# Patient Record
Sex: Female | Born: 1990 | Race: White | Hispanic: No | Marital: Single | Smoking: Never smoker
Health system: Southern US, Community
[De-identification: ages and names within clinical notes are randomized; demographics above are authoritative.]

## PROBLEM LIST (undated history)

## (undated) DIAGNOSIS — Z8619 Personal history of other infectious and parasitic diseases: Secondary | ICD-10-CM

## (undated) DIAGNOSIS — E119 Type 2 diabetes mellitus without complications: Secondary | ICD-10-CM

## (undated) DIAGNOSIS — R87629 Unspecified abnormal cytological findings in specimens from vagina: Secondary | ICD-10-CM

## (undated) DIAGNOSIS — A749 Chlamydial infection, unspecified: Secondary | ICD-10-CM

## (undated) HISTORY — PX: WISDOM TOOTH EXTRACTION: SHX21

## (undated) HISTORY — DX: Type 2 diabetes mellitus without complications: E11.9

---
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
## 2014-02-18 ENCOUNTER — Encounter: Payer: Self-pay | Admitting: *Deleted

## 2014-02-18 ENCOUNTER — Encounter: Payer: BC Managed Care – PPO | Attending: Endocrinology | Admitting: *Deleted

## 2014-02-18 VITALS — Ht 72.6 in | Wt 134.8 lb

## 2014-02-18 DIAGNOSIS — Z713 Dietary counseling and surveillance: Secondary | ICD-10-CM | POA: Insufficient documentation

## 2014-02-18 DIAGNOSIS — E1010 Type 1 diabetes mellitus with ketoacidosis without coma: Secondary | ICD-10-CM

## 2014-02-18 DIAGNOSIS — F509 Eating disorder, unspecified: Secondary | ICD-10-CM

## 2014-02-18 DIAGNOSIS — E109 Type 1 diabetes mellitus without complications: Secondary | ICD-10-CM | POA: Insufficient documentation

## 2014-02-18 NOTE — Patient Instructions (Signed)
Check glucose before each meal/snack.  Keep log and bring to next visit.  Also bring lab results

## 2014-02-18 NOTE — Progress Notes (Signed)
Patient was seen on 02/18/14 for nutrition counseling pertaining to disordered eating  Primary care MD: none Therapist: Dr. Rayann Heman at [facility] (weekly) and [facility] (weekly) Any other medical team members: Ardyth Harps, endocrinology (every 3 months).  Can see Wynne Dust  Assessment Weight 134 lb  Height 62 in Expected body weight: 160 lb Percent expected body weight: 83%  Eating history: Length of time: type 1 DM diagnosed at 15.  States she took the diagnosis pretty well.  Didn't fully understand what was going on.  Needed very low doses of insulin so it didn't change her life as much.  When she started having control issues around age [age].  Her glucose levels raised so high she had to stay home from school.  That stated to have an impact on her mental health and she didn't have support from her teachers.  She gained some weight in that time.  When she went off to college she had irregular eating schedules.  She fell into not taking her insulin by accident.  When she realized that she was loosing weight as a result, she started doing it on purpose.  She could eat whatever she wanted and lose weight.  She gets sick now very easily.  before she could go longer without getting sick.  She almost thought she didn't have dm because she wasn't getting sick.    States she feels "not good" about her body.  She got triggered when she saw her weight at Dr. Rinaldo Cloud office: 145 lb about 2 weeks ago.  She is terrified of going over 140 lb.  She has been really sick this year with not taking her insulin. She dropped out of school this semester.    DM: doesn't know how to count carbs or check feet.  Monitors glucose daily now: 200s-300s.  Hasnt' has any 400+ in the past week or so.  Used to not check for days.  Does take lantus 20 units.  Doesn't take novolog properly:: might wait to take insulin and then not take the correct amount.  hasnt' been takign novolog in a VERY long time. Knows healthy  ranges for blood sugars.  Last A1c was 14%  Previous treatments: worked with a nutritionist when she was diagnosed with diabetes,but not with her eating and doesn't remember much from previous meetings.  Has been working with [facility] for about a month or so.  Attempted to do inpatient treatment at [facility]: rushed to the ICU after 2 hours Goals for RD meetings: would like guidance on what to eat, how to eat.  Knows she doesn't have a good relationship with good.    Weight history:  Highest weight: 167 lb   Lowest weight: 126 lb Most consistent weight: 130-140  What would you like to weigh: < 135 How has weight changed in the past year: flucuated at lot.  Went through spells of taking insulin and not taking.  152-134  Medical Information:  Changes in hair, skin, nails since ED started: hair is very thin and brittle, dry skin and blisters on her hands.  Some neuropathy in her feet at night Chewing/swallowing difficulties: none Relux or heartburn: none, but feels pressure in her chest Trouble with teeth: none LMP without the use of hormones: started OCP in fall of 2014.  Have always had light periods.  They were fairly regular, but now she only has her cycle every few months.  Has gone up to 6 months without her period  Effect of exercise on menses    Effect of hormones on menses Constipation, diarrhea: normal bowel function  Mental health diagnosis: ED-DMT1   Dietary assessment: A typical day consists of 2-3 meals and 1-2 snacks  Safe foods include: raw vegetables Avoided foods include:pasta (will eat whatever when she doesn't take her insulin)  When she takes her insulin, she is much more restrictive  24 hour recall:  B: nothing L: chicken sandwich D: didn't eat much S: pot roast S: greek yogurt  [place] today Drinks a lot of diet cranberry juice, lots of diet drinks, water, diet Powerade   Compensatory behaviors           Restricting (calories, fat, carbs): calories [phone]  kcal/day.  SIV: denied  Diet pills: denies  Laxatives: denies  Diuretics:denies  Alcohol or drugs: denies  Exercise (what type): not much during the winter.  Has stationary bike or exercise videos.  None currently  Food rules or rituals (explain): denies  Binge: when not taking novolog  Estimated energy intake: [phone] kcal  Estimated energy needs: 2400 kcal 300 g CHO 120 g pro 80 g fat  Nutrition Diagnosis: NB-1.2 Harmful beliefs/attitudes about food or nutrition-related topics As related to adequate intake of fats, carbohydrates, proteins, and total calories.  As evidenced by insulin misuse and disordered eating.  Intervention/Goals: check blood sugar prior to eating and keep log   Meal plan:    3 meals    0 snacks To provide 1200 kcal    135 g CHO    90 g pro   33 g fat   Monitoring and Evaluation: Patient will follow up in 1 weeks.

## 2014-02-25 ENCOUNTER — Ambulatory Visit: Payer: BC Managed Care – PPO | Admitting: *Deleted

## 2014-02-25 ENCOUNTER — Ambulatory Visit: Payer: Self-pay | Admitting: *Deleted

## 2014-08-26 ENCOUNTER — Encounter: Payer: Self-pay | Admitting: Nurse Practitioner

## 2014-08-27 ENCOUNTER — Encounter (HOSPITAL_COMMUNITY): Payer: Self-pay | Admitting: *Deleted

## 2014-08-27 ENCOUNTER — Inpatient Hospital Stay (HOSPITAL_COMMUNITY): Payer: BC Managed Care – PPO

## 2014-08-27 ENCOUNTER — Inpatient Hospital Stay (HOSPITAL_COMMUNITY)
Admission: AD | Admit: 2014-08-27 | Discharge: 2014-08-27 | Disposition: A | Discharge status: Home / Self Care | Payer: BC Managed Care – PPO | Source: Ambulatory Visit | Attending: Obstetrics and Gynecology | Admitting: Obstetrics and Gynecology

## 2014-08-27 DIAGNOSIS — L293 Anogenital pruritus, unspecified: Secondary | ICD-10-CM | POA: Insufficient documentation

## 2014-08-27 DIAGNOSIS — N764 Abscess of vulva: Secondary | ICD-10-CM

## 2014-08-27 DIAGNOSIS — B3731 Acute candidiasis of vulva and vagina: Secondary | ICD-10-CM

## 2014-08-27 DIAGNOSIS — E109 Type 1 diabetes mellitus without complications: Secondary | ICD-10-CM | POA: Diagnosis not present

## 2014-08-27 DIAGNOSIS — B373 Candidiasis of vulva and vagina: Secondary | ICD-10-CM

## 2014-08-27 LAB — URINALYSIS, ROUTINE W REFLEX MICROSCOPIC
BILIRUBIN URINE: NEGATIVE
Glucose, UA: >1000 mg/dL — AB
Hgb urine dipstick: NEGATIVE
Ketones, ur: NEGATIVE mg/dL
Leukocytes, UA: NEGATIVE
NITRITE: NEGATIVE
Protein, ur: NEGATIVE mg/dL
Specific Gravity, Urine: <1.005 — ABNORMAL LOW (ref 1.005–1.030)
UROBILINOGEN UA: 0.2 mg/dL (ref 0.0–1.0)
pH: 6 (ref 5.0–8.0)

## 2014-08-27 LAB — WET PREP, GENITAL
Clue Cells Wet Prep HPF POC: NONE SEEN
Trich, Wet Prep: NONE SEEN
Yeast Wet Prep HPF POC: NONE SEEN

## 2014-08-27 LAB — GLUCOSE, CAPILLARY: GLUCOSE-CAPILLARY: 115 mg/dL — AB (ref 70–99)

## 2014-08-27 LAB — POCT PREGNANCY, URINE: Preg Test, Ur: NEGATIVE

## 2014-08-27 MED ORDER — DIPHENHYDRAMINE HCL 50 MG/ML IJ SOLN
25.0000 mg | Freq: Once | INTRAMUSCULAR | Status: AC
  Administered 2014-08-27: 09:00:00 via INTRAMUSCULAR
  Filled 2014-08-27: qty 1

## 2014-08-27 MED ORDER — FLUCONAZOLE 150 MG PO TABS: 150.0000 mg | ORAL_TABLET | Freq: Once | ORAL | Status: DC

## 2014-08-27 NOTE — MAU Note (Signed)
Pt presents to MAU with complaints of vaginal itching that has gotten worse over the last couple of days. Reports that she was evaluated at [facility] the end of last week for a regular check-up and given vaginal cream. Presents here today requesting to be evaluated by provider and prefers it not to be the [employer].

## 2014-08-27 NOTE — MAU Provider Note (Signed)
CC: Vaginal Itching    First Provider Initiated Contact with Patient 08/27/14 0830    Note: Seen for this problem at [facility] about a week ago and requests to be seen by a different practice, though we offered to call [facility] provider on call   HPI Carmen Henry is a 23 y.o.  nulligravda Type 1 diabetic who presents with onset a week ago of vulvar and vaginal pruritis which has worsened in the past 2 days. Unable to sleep due to itching. Took Advil this am. States she had STD testing and negative WP at [facility] and was given steroid cream to apply to vulva twice a day for 10 d. She states vulvar redness is worse and now has bump on labia. CBG 400s this am and dosed insulin, did not eat. Stressed out and missing work today.  LMP 08/03/14. Had amenorrhea related to weight loss and cycles remain irregular, taking Loestrin as directed. Mother very concerned that pelvic mass noted at GYN exam and advised to get [place]. Began Gardasil series.  Past Medical History  Diagnosis Date  . Diabetes mellitus without complication     OB History  No data available    History reviewed. No pertinent past surgical history.  History   Social History  . Marital Status: Single    Spouse Name: N/A    Number of Children: N/A  . Years of Education: N/A   Occupational History  . Not on file.   Social History Main Topics  . Smoking status: Never Smoker   . Smokeless tobacco: Not on file  . Alcohol Use: No  . Drug Use: No  . Sexual Activity: Yes    Birth Control/ Protection: Pill   Other Topics Concern  . Not on file   Social History Narrative  . No narrative on file    No current facility-administered medications on file prior to encounter.   Current Outpatient Prescriptions on File Prior to Encounter  Medication Sig Dispense Refill  . insulin aspart (NOVOLOG) 100 UNIT/ML injection Inject 1-40 Units into the skin 5 (five) times daily as needed for high blood sugar (sliding scales based on carbs  eaten).       . insulin glargine (LANTUS) 100 UNIT/ML injection Inject 25 Units into the skin at bedtime.       . Multiple Vitamin (MULTIVITAMIN) tablet Take 1 tablet by mouth daily.        No Known Allergies  ROS Pertinent items in HPI. Denies dysuria, urgency or frequency of urination, hematuria. Denies dyspareunia.  PHYSICAL EXAM Filed Vitals:   08/27/14 0821  BP: 127/95  Pulse: 105  Temp: 98.3 F (36.8 C)  Resp: 18     General: Well nourished, well developed female in some distress and anxious Cardiovascular: Normal rate Respiratory: Normal effort Abdomen: Soft, nontender Back: No CVAT Extremities: No edema Neurologic: Alert and oriented Speculum exam: External genitalia Very erythematous with stellate lesions, shaved pubic hair site, Left mid labium majora with 1 cm mildly tender lump, not to a head, no drainage. Vagina with erythema and physiologic white discharge, no blood; cervix clean Bimanual exam: cervix closed, no CMT; uterus NSSP; no adnexal tenderness or masses   LAB RESULTS Results for orders placed during the hospital encounter of 08/27/14 (from the past 24 hour(s))  URINALYSIS, ROUTINE W REFLEX MICROSCOPIC     Status: Abnormal   Collection Time    08/27/14  8:05 AM      Result Value Ref Range  Color, Urine YELLOW  YELLOW   APPearance CLEAR  CLEAR   Specific Gravity, Urine <1.005 (*) 1.005 - 1.030   pH 6.0  5.0 - 8.0   Glucose, UA >1000 (*) NEGATIVE mg/dL   Hgb urine dipstick NEGATIVE  NEGATIVE   Bilirubin Urine NEGATIVE  NEGATIVE   Ketones, ur NEGATIVE  NEGATIVE mg/dL   Protein, ur NEGATIVE  NEGATIVE mg/dL   Urobilinogen, UA 0.2  0.0 - 1.0 mg/dL   Nitrite NEGATIVE  NEGATIVE   Leukocytes, UA NEGATIVE  NEGATIVE  WET PREP, GENITAL     Status: Abnormal   Collection Time    08/27/14  8:55 AM      Result Value Ref Range   Yeast Wet Prep HPF POC NONE SEEN  NONE SEEN   Trich, Wet Prep NONE SEEN  NONE SEEN   Clue Cells Wet Prep HPF POC NONE SEEN  NONE  SEEN   WBC, Wet Prep HPF POC FEW (*) NONE SEEN  Pt  self-checked CBG 204, 1-2 hr later was 48 and food, juice given  IMAGING [place] prelim: no pelvic mass, sm amt FF  No results found. MAU COURSE Benadryl  IM given ROI [facility] encounter 08/20/14 for GYN exam. Records reviewed including documentation of midline pelvic mass. D/W Dr. Jolayne Panther re outpt [place] and F/U GYN clinic. Patient and her mother requesting [place] today due to school/work situation.   ASSESSMENT  1. Yeast infection involving the vagina and surrounding area   2. Furuncle of labia majora   Type 1 DM  PLAN Discharge home. See AVS for patient education.    Medication List         clobetasol ointment 0.05 %  Commonly known as:  TEMOVATE  Apply 1 application topically 2 (two) times daily.     fluconazole 150 MG tablet  Commonly known as:  DIFLUCAN  Take 1 tablet (150 mg total) by mouth once.     insulin aspart 100 UNIT/ML injection  Commonly known as:  novoLOG  Inject 1-40 Units into the skin 5 (five) times daily as needed for high blood sugar (sliding scales based on carbs eaten).     insulin glargine 100 UNIT/ML injection  Commonly known as:  LANTUS  Inject 25 Units into the skin at bedtime.     multivitamin tablet  Take 1 tablet by mouth daily.     norethindrone-ethinyl estradiol-iron 1.5-30 MG-MCG tablet  Commonly known as:  MICROGESTIN FE,GILDESS FE,LOESTRIN FE  Take 1 tablet by mouth daily.       Advised to call PMD re: glycemic control , Follow-up Information   Please follow up. (Keep your scheduled appointment with [facility] for follow up and Gardasil)       Danae Orleans, CNM 08/27/2014 8:57 AM

## 2014-08-27 NOTE — Discharge Instructions (Signed)
Abscess An abscess is an infected area that contains a collection of pus and debris.It can occur in almost any part of the body. An abscess is also known as a furuncle or boil. CAUSES  An abscess occurs when tissue gets infected. This can occur from blockage of oil or sweat glands, infection of hair follicles, or a minor injury to the skin. As the body tries to fight the infection, pus collects in the area and creates pressure under the skin. This pressure causes pain. People with weakened immune systems have difficulty fighting infections and get certain abscesses more often.  SYMPTOMS Usually an abscess develops on the skin and becomes a painful mass that is red, warm, and tender. If the abscess forms under the skin, you may feel a moveable soft area under the skin. Some abscesses break open (rupture) on their own, but most will continue to get worse without care. The infection can spread deeper into the body and eventually into the bloodstream, causing you to feel ill.  DIAGNOSIS  Your caregiver will take your medical history and perform a physical exam. A sample of fluid may also be taken from the abscess to determine what is causing your infection. TREATMENT  Your caregiver may prescribe antibiotic medicines to fight the infection. However, taking antibiotics alone usually does not cure an abscess. Your caregiver may need to make a small cut (incision) in the abscess to drain the pus. In some cases, gauze is packed into the abscess to reduce pain and to continue draining the area. HOME CARE INSTRUCTIONS   Only take over-the-counter or prescription medicines for pain, discomfort, or fever as directed by your caregiver.  If you were prescribed antibiotics, take them as directed. Finish them even if you start to feel better.  If gauze is used, follow your caregiver's directions for changing the gauze.  To avoid spreading the infection:  Keep your draining abscess covered with a  bandage.  Wash your hands well.  Do not share personal care items, towels, or whirlpools with others.  Avoid skin contact with others.  Keep your skin and clothes clean around the abscess.  Keep all follow-up appointments as directed by your caregiver. SEEK MEDICAL CARE IF:   You have increased pain, swelling, redness, fluid drainage, or bleeding.  You have muscle aches, chills, or a general ill feeling.  You have a fever. MAKE SURE YOU:   Understand these instructions.  Will watch your condition.  Will get help right away if you are not doing well or get worse. Document Released: 09/12/2005 Document Revised: 06/03/2012 Document Reviewed: 02/15/2012 [facility] Patient Information 2015 [place], [place]. This information is not intended to replace advice given to you by your health care provider. Make sure you discuss any questions you have with your health care provider.  Candida Infection A Candida infection (also called yeast, fungus, and Monilia infection) is an overgrowth of yeast that can occur anywhere on the body. A yeast infection commonly occurs in warm, moist body areas. Usually, the infection remains localized but can spread to become a systemic infection. A yeast infection may be a sign of a more severe disease such as diabetes, leukemia, or AIDS. A yeast infection can occur in both men and women. In women, Candida vaginitis is a vaginal infection. It is one of the most common causes of vaginitis. Men usually do not have symptoms or know they have an infection until other problems develop. Men may find out they have a yeast infection because  their sex partner has a yeast infection. Uncircumcised men are more likely to get a yeast infection than circumcised men. This is because the uncircumcised glans is not exposed to air and does not remain as dry as that of a circumcised glans. Older adults may develop yeast infections around dentures. CAUSES   Women  Antibiotics.  Steroid medication taken for a long time.  Being overweight (obese).  Diabetes.  Poor immune condition.  Certain serious medical conditions.  Immune suppressive medications for organ transplant patients.  Chemotherapy.  Pregnancy.  Menstruation.  Stress and fatigue.  Intravenous drug use.  Oral contraceptives.  Wearing tight-fitting clothes in the crotch area.  Catching it from a sex partner who has a yeast infection.  Spermicide.  Intravenous, urinary, or other catheters. Men  Catching it from a sex partner who has a yeast infection.  Having oral or anal sex with a person who has the infection.  Spermicide.  Diabetes.  Antibiotics.  Poor immune system.  Medications that suppress the immune system.  Intravenous drug use.  Intravenous, urinary, or other catheters. SYMPTOMS  Women  Thick, white vaginal discharge.  Vaginal itching.  Redness and swelling in and around the vagina.  Irritation of the lips of the vagina and perineum.  Blisters on the vaginal lips and perineum.  Painful sexual intercourse.  Low blood sugar (hypoglycemia).  Painful urination.  Bladder infections.  Intestinal problems such as constipation, indigestion, bad breath, bloating, increase in gas, diarrhea, or loose stools. Men  Men may develop intestinal problems such as constipation, indigestion, bad breath, bloating, increase in gas, diarrhea, or loose stools.  Dry, cracked skin on the penis with itching or discomfort.  Jock itch.  Dry, flaky skin.  Athlete's foot.  Hypoglycemia. DIAGNOSIS  Women  A history and an exam are performed.  The discharge may be examined under a microscope.  A culture may be taken of the discharge. Men  A history and an exam are performed.  Any discharge from the penis or areas of cracked skin will be looked at under the microscope and cultured.  Stool samples may be cultured. TREATMENT   Women  Vaginal antifungal suppositories and creams.  Medicated creams to decrease irritation and itching on the outside of the vagina.  Warm compresses to the perineal area to decrease swelling and discomfort.  Oral antifungal medications.  Medicated vaginal suppositories or cream for repeated or recurrent infections.  Wash and dry the irritation areas before applying the cream.  Eating yogurt with Lactobacillus may help with prevention and treatment.  Sometimes painting the vagina with gentian violet solution may help if creams and suppositories do not work. Men  Antifungal creams and oral antifungal medications.  Sometimes treatment must continue for 30 days after the symptoms go away to prevent recurrence. HOME CARE INSTRUCTIONS  Women  Use cotton underwear and avoid tight-fitting clothing.  Avoid colored, scented toilet paper and deodorant tampons or pads.  Do not douche.  Keep your diabetes under control.  Finish all the prescribed medications.  Keep your skin clean and dry.  Consume milk or yogurt with Lactobacillus-active culture regularly. If you get frequent yeast infections and think that is what the infection is, there are over-the-counter medications that you can get. If the infection does not show healing in 3 days, talk to your caregiver.  Tell your sex partner you have a yeast infection. Your partner may need treatment also, especially if your infection does not clear up or recurs. Men  Keep  your skin clean and dry.  Keep your diabetes under control.  Finish all prescribed medications.  Tell your sex partner that you have a yeast infection so he or she can be treated if necessary. SEEK MEDICAL CARE IF:   Your symptoms do not clear up or worsen in one week after treatment.  You have an oral temperature above 102 F (38.9 C).  You have trouble swallowing or eating for a prolonged time.  You develop blisters on and around your vagina.  You  develop vaginal bleeding and it is not your menstrual period.  You develop abdominal pain.  You develop intestinal problems as mentioned above.  You get weak or light-headed.  You have painful or increased urination.  You have pain during sexual intercourse. MAKE SURE YOU:   Understand these instructions.  Will watch your condition.  Will get help right away if you are not doing well or get worse. Document Released: 01/10/2005 Document Revised: 04/19/2014 Document Reviewed: 04/24/2010 [facility] Patient Information 2015 [place], [place]. This information is not intended to replace advice given to you by your health care provider. Make sure you discuss any questions you have with your health care provider.

## 2014-08-31 NOTE — MAU Provider Note (Signed)
Attestation of Attending Supervision of Advanced Practitioner (CNM/NP): Evaluation and management procedures were performed by the Advanced Practitioner under my supervision and collaboration.  I have reviewed the Advanced Practitioner's note and chart, and I agree with the management and plan.  Waynette Towers 08/31/2014 9:32 AM   

## 2014-10-13 ENCOUNTER — Encounter (HOSPITAL_COMMUNITY): Payer: Self-pay | Admitting: *Deleted

## 2014-10-13 ENCOUNTER — Inpatient Hospital Stay (HOSPITAL_COMMUNITY)
Admission: AD | Admit: 2014-10-13 | Discharge: 2014-10-13 | Disposition: A | Discharge status: Home / Self Care | Payer: BC Managed Care – PPO | Source: Ambulatory Visit | Attending: Obstetrics & Gynecology | Admitting: Obstetrics & Gynecology

## 2014-10-13 DIAGNOSIS — B373 Candidiasis of vulva and vagina: Secondary | ICD-10-CM

## 2014-10-13 DIAGNOSIS — L292 Pruritus vulvae: Secondary | ICD-10-CM | POA: Diagnosis not present

## 2014-10-13 DIAGNOSIS — B3731 Acute candidiasis of vulva and vagina: Secondary | ICD-10-CM

## 2014-10-13 HISTORY — DX: Unspecified abnormal cytological findings in specimens from vagina: R87.629

## 2014-10-13 HISTORY — DX: Chlamydial infection, unspecified: A74.9

## 2014-10-13 HISTORY — DX: Personal history of other infectious and parasitic diseases: Z86.19

## 2014-10-13 LAB — URINALYSIS, ROUTINE W REFLEX MICROSCOPIC
Bilirubin Urine: NEGATIVE
Glucose, UA: >1000 mg/dL — AB
Ketones, ur: >80 mg/dL — AB
LEUKOCYTES UA: NEGATIVE
NITRITE: NEGATIVE
PH: 5.5 (ref 5.0–8.0)
Protein, ur: NEGATIVE mg/dL
Specific Gravity, Urine: 1.025 (ref 1.005–1.030)
Urobilinogen, UA: 0.2 mg/dL (ref 0.0–1.0)

## 2014-10-13 LAB — URINE MICROSCOPIC-ADD ON

## 2014-10-13 LAB — WET PREP, GENITAL
Clue Cells Wet Prep HPF POC: NONE SEEN
TRICH WET PREP: NONE SEEN
YEAST WET PREP: NONE SEEN

## 2014-10-13 LAB — GLUCOSE, CAPILLARY: GLUCOSE-CAPILLARY: 336 mg/dL — AB (ref 70–99)

## 2014-10-13 LAB — HIV ANTIBODY (ROUTINE TESTING W REFLEX): HIV 1&2 Ab, 4th Generation: NONREACTIVE

## 2014-10-13 LAB — POCT PREGNANCY, URINE: PREG TEST UR: NEGATIVE

## 2014-10-13 MED ORDER — FLUCONAZOLE 150 MG PO TABS: 150.0000 mg | ORAL_TABLET | Freq: Every day | ORAL | Status: AC

## 2014-10-13 MED ORDER — TERCONAZOLE 0.8 % VA CREA: 1.0000 | TOPICAL_CREAM | VAGINAL | Status: AC | PRN

## 2014-10-13 NOTE — MAU Provider Note (Signed)
History     CSN: [id number]  Arrival date and time: 10/13/14 [id number]   First Provider Initiated Contact with Patient 10/13/14 0805      Chief Complaint  Patient presents with  . Vaginal irritation, burning, inflammation    HPI  Carmen Henry is a 23 y.o.female G0P0 who presents with vaginal irritation, burning around the vagina with itching. She was here a month ago for "severe vaginal itching" she was treated with benadryl and antibiotics and it got better. 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 She is concerned because she has a boyfriend X 8 months and they do not use condoms. She wants to make sure this is ok.  She does have some "bumps" on her vagina, however feels this is related to shaving.   OB History   Grav Para Term Preterm Abortions TAB SAB Ect Mult Living   0               Past Medical History  Diagnosis Date  . Diabetes mellitus without complication   . Vaginal Pap smear, abnormal   . History of HPV infection   . Chlamydia     Past Surgical History  Procedure Laterality Date  . Wisdom tooth extraction      History reviewed. No pertinent family history.  History  Substance Use Topics  . Smoking status: Never Smoker   . Smokeless tobacco: Not on file  . Alcohol Use: No    Allergies: No Known Allergies  Prescriptions prior to admission  Medication Sig Dispense Refill  . clobetasol ointment (TEMOVATE) 0.05 % Apply 1 application topically 2 (two) times daily.      . fluconazole (DIFLUCAN) 150 MG tablet Take 1 tablet (150 mg total) by mouth once.  3 tablet  0  . insulin aspart (NOVOLOG) 100 UNIT/ML injection Inject 1-40 Units into the skin 5 (five) times daily as needed for high blood sugar (sliding scales based on carbs eaten).       . insulin glargine (LANTUS) 100 UNIT/ML injection Inject 25 Units into the skin at bedtime.       . Multiple Vitamin (MULTIVITAMIN) tablet  Take 1 tablet by mouth daily.      . norethindrone-ethinyl estradiol-iron (MICROGESTIN FE,GILDESS FE,LOESTRIN FE) 1.5-30 MG-MCG tablet Take 1 tablet by mouth daily.       Results for orders placed during the hospital encounter of 10/13/14 (from the past 48 hour(s))  URINALYSIS, ROUTINE W REFLEX MICROSCOPIC     Status: Abnormal   Collection Time    10/13/14  7:30 AM      Result Value Ref Range   Color, Urine YELLOW  YELLOW   APPearance CLEAR  CLEAR   Specific Gravity, Urine 1.025  1.005 - 1.030   pH 5.5  5.0 - 8.0   Glucose, UA >1000 (*) NEGATIVE mg/dL   Hgb urine dipstick TRACE (*) NEGATIVE   Bilirubin Urine NEGATIVE  NEGATIVE   Ketones, ur >80 (*) NEGATIVE mg/dL   Protein, ur NEGATIVE  NEGATIVE mg/dL   Urobilinogen, UA 0.2  0.0 - 1.0 mg/dL   Nitrite NEGATIVE  NEGATIVE   Leukocytes, UA NEGATIVE  NEGATIVE  URINE MICROSCOPIC-ADD ON     Status: None   Collection Time    10/13/14  7:30 AM      Result Value Ref Range   Squamous Epithelial / LPF RARE  RARE  WBC, UA 7-10  <3 WBC/hpf   Bacteria, UA RARE  RARE  POCT PREGNANCY, URINE     Status: None   Collection Time    10/13/14  7:46 AM      Result Value Ref Range   Preg Test, Ur NEGATIVE  NEGATIVE   Comment:            THE SENSITIVITY OF THIS     METHODOLOGY IS >24 mIU/mL  WET PREP, GENITAL     Status: Abnormal   Collection Time    10/13/14  8:30 AM      Result Value Ref Range   Yeast Wet Prep HPF POC NONE SEEN  NONE SEEN   Trich, Wet Prep NONE SEEN  NONE SEEN   Clue Cells Wet Prep HPF POC NONE SEEN  NONE SEEN   WBC, Wet Prep HPF POC MODERATE (*) NONE SEEN   Comment: FEW BACTERIA SEEN  HIV ANTIBODY (ROUTINE TESTING)     Status: None   Collection Time    10/13/14  8:39 AM      Result Value Ref Range   HIV 1&2 Ab, 4th Generation NONREACTIVE  NONREACTIVE   Comment: (NOTE)     A NONREACTIVE HIV Ag/Ab result does not exclude HIV infection since     the time frame for seroconversion is variable. If acute HIV infection     is  suspected, a HIV-1 RNA Qualitative TMA test is recommended.     HIV-1/2 Antibody Diff         Not indicated.     HIV-1 RNA, Qual TMA           Not indicated.     PLEASE NOTE: This information has been disclosed to you from records     whose confidentiality may be protected by state law. If your state     requires such protection, then the state law prohibits you from making     any further disclosure of the information without the specific written     consent of the person to whom it pertains, or as otherwise permitted     by law. A general authorization for the release of medical or other     information is NOT sufficient for this purpose.     The performance of this assay has not been clinically validated in     patients less than 32 years old.     Performed at [facility]  GLUCOSE, CAPILLARY     Status: Abnormal   Collection Time    10/13/14  8:56 AM      Result Value Ref Range   Glucose-Capillary 336 (*) 70 - 99 mg/dL    Review of Systems  Constitutional: Negative for fever and chills.  Gastrointestinal: Negative for abdominal pain.  Genitourinary: Negative for dysuria, urgency, frequency, hematuria and flank pain.       No vaginal discharge+ white, no odor  No vaginal bleeding. No dysuria.    Physical Exam   Blood pressure 123/85, pulse 112, temperature 97.4 F (36.3 C), temperature source Oral, resp. rate 16, height 6' (1.829 m), weight 67.132 kg (148 lb), last menstrual period 09/28/2014.  Physical Exam  Constitutional: She is oriented to person, place, and time. She appears well-developed and well-nourished. No distress.  HENT:  Head: Normocephalic.  Eyes: Pupils are equal, round, and reactive to light.  Neck: Neck supple.  Respiratory: Effort normal.  GI: Soft.  Genitourinary: There is tenderness on the right labia. There  is tenderness on the left labia. There is erythema around the vagina.  Edema and erythema noted to external genitalia. Yeast like  discharge; thick, clumpy, white noted on external genitalia.   Musculoskeletal: Normal range of motion.  Neurological: She is alert and oriented to person, place, and time.  Skin: Skin is warm. She is not diaphoretic.  Psychiatric: Her behavior is normal.    MAU Course  Procedures None  MDM Wet pre GC HIV   Assessment and Plan   A: 1. Yeast vaginitis   2. Pruritus vulvae    P: Discharge home in stable condition Diflucan, terizol cream for external use (PRN) Emmollient daily to external genitalia  List of local OBGYN providers in the area provided to the patient.Encouraged her to establish care.  Ok to use benadryl over the counter as needed, as directed on the bottle.    Iona HansenJennifer Irene Koral Thaden, NP 10/13/2014 3:39 PM

## 2014-10-13 NOTE — Discharge Instructions (Signed)
Pruritus  Pruritus is an itch. There are many different problems that can cause an itch. Dry skin is one of the most common causes of itching. Most cases of itching do not require medical attention.  HOME CARE INSTRUCTIONS  Make sure your skin is moistened on a regular basis. A moisturizer that contains petroleum jelly is best for keeping moisture in your skin. If you develop a rash, you may try the following for relief:   Use corticosteroid cream.  Apply cool compresses to the affected areas.  Bathe with Epsom salts or baking soda in the bathwater.  Soak in colloidal oatmeal baths. These are available at your pharmacy.  Apply baking soda paste to the rash. Stir water into baking soda until it reaches a paste-like consistency.  Use an anti-itch lotion.  Take over-the-counter diphenhydramine medicine by mouth as the instructions direct.  Avoid scratching. Scratching may cause the rash to become infected. If itching is very bad, your caregiver may suggest prescription lotions or creams to lessen your symptoms.  Avoid hot showers, which can make itching worse. A cold shower may help with itching as long as you use a moisturizer after the shower. SEEK MEDICAL CARE IF: The itching does not go away after several days. Document Released: 08/15/2011 Document Revised: 04/19/2014 Document Reviewed: 08/15/2011 [facility] Patient Information 2015 [place], [place]. This information is not intended to replace advice given to you by your health care provider. Make sure you discuss any questions you have with your health care provider.  Candidal Vulvovaginitis Candidal vulvovaginitis is an infection of the vagina and vulva. The vulva is the skin around the opening of the vagina. This may cause itching and discomfort in and around the vagina.  HOME CARE  Only take medicine as told by your doctor.  Do not have sex (intercourse) until the infection is healed or as told by your doctor.  Practice safe  sex.  Tell your sex partner about your infection.  Do not douche or use tampons.  Wear cotton underwear. Do not wear tight pants or panty hose.  Eat yogurt. This may help treat and prevent yeast infections. GET HELP RIGHT AWAY IF:   You have a fever.  Your problems get worse during treatment or do not get better in 3 days.  You have discomfort, irritation, or itching in your vagina or vulva area.  You have pain after sex.  You start to get belly (abdominal) pain. MAKE SURE YOU:  Understand these instructions.  Will watch your condition.  Will get help right away if you are not doing well or get worse. Document Released: 03/01/2009 Document Revised: 12/08/2013 Document Reviewed: 03/01/2009 [facility] Patient Information 2015 [place], [place]. This information is not intended to replace advice given to you by your health care provider. Make sure you discuss any questions you have with your health care provider.

## 2014-10-13 NOTE — MAU Note (Signed)
Vaginal itching X 1 week. Now is red and inflamed. White D/C, no odor. Was seen by Dr. Ernestina PennaFogleman, dx with HPV, had abnormal PAP. States she does not feel her questions were answered regarding dx. Patient states she wants to find another provider and does not want to be [facility] today.

## 2014-10-14 LAB — GC/CHLAMYDIA PROBE AMP
CT Probe RNA: NEGATIVE
GC Probe RNA: NEGATIVE

## 2016-06-16 DEATH — deceased
